# Patient Record
Sex: Female | Born: 1935 | Race: White | Hispanic: No | Smoking: Never smoker
Health system: Southern US, Community
[De-identification: ages and names within clinical notes are randomized; demographics above are authoritative.]

## PROBLEM LIST (undated history)

## (undated) DIAGNOSIS — E119 Type 2 diabetes mellitus without complications: Secondary | ICD-10-CM

## (undated) DIAGNOSIS — F329 Major depressive disorder, single episode, unspecified: Secondary | ICD-10-CM

## (undated) DIAGNOSIS — M6281 Muscle weakness (generalized): Secondary | ICD-10-CM

## (undated) DIAGNOSIS — I639 Cerebral infarction, unspecified: Secondary | ICD-10-CM

## (undated) DIAGNOSIS — R269 Unspecified abnormalities of gait and mobility: Secondary | ICD-10-CM

## (undated) DIAGNOSIS — G309 Alzheimer's disease, unspecified: Secondary | ICD-10-CM

## (undated) DIAGNOSIS — F32A Depression, unspecified: Secondary | ICD-10-CM

## (undated) DIAGNOSIS — F0390 Unspecified dementia without behavioral disturbance: Secondary | ICD-10-CM

## (undated) DIAGNOSIS — I10 Essential (primary) hypertension: Secondary | ICD-10-CM

## (undated) DIAGNOSIS — F0280 Dementia in other diseases classified elsewhere without behavioral disturbance: Secondary | ICD-10-CM

## (undated) DIAGNOSIS — M810 Age-related osteoporosis without current pathological fracture: Secondary | ICD-10-CM

## (undated) DIAGNOSIS — R1310 Dysphagia, unspecified: Secondary | ICD-10-CM

## (undated) DIAGNOSIS — E785 Hyperlipidemia, unspecified: Secondary | ICD-10-CM

---

## 2000-03-31 ENCOUNTER — Other Ambulatory Visit: Admission: RE | Admit: 2000-03-31 | Discharge: 2000-03-31 | Payer: Self-pay | Admitting: Family Medicine

## 2003-03-29 ENCOUNTER — Emergency Department (HOSPITAL_COMMUNITY): Admission: EM | Admit: 2003-03-29 | Discharge: 2003-03-29 | Payer: Self-pay | Admitting: Emergency Medicine

## 2003-03-29 ENCOUNTER — Inpatient Hospital Stay (HOSPITAL_COMMUNITY): Admission: AD | Admit: 2003-03-29 | Discharge: 2003-03-31 | Payer: Self-pay | Admitting: Neurology

## 2003-03-29 ENCOUNTER — Encounter: Payer: Self-pay | Admitting: Neurology

## 2003-03-30 ENCOUNTER — Encounter: Payer: Self-pay | Admitting: Neurology

## 2003-04-12 ENCOUNTER — Encounter: Admission: RE | Admit: 2003-04-12 | Discharge: 2003-05-10 | Payer: Self-pay | Admitting: Neurology

## 2003-08-09 ENCOUNTER — Inpatient Hospital Stay (HOSPITAL_COMMUNITY): Admission: EM | Admit: 2003-08-09 | Discharge: 2003-08-13 | Payer: Self-pay | Admitting: *Deleted

## 2003-08-09 ENCOUNTER — Encounter: Payer: Self-pay | Admitting: *Deleted

## 2003-09-25 ENCOUNTER — Ambulatory Visit (HOSPITAL_COMMUNITY): Admission: RE | Admit: 2003-09-25 | Discharge: 2003-09-25 | Payer: Self-pay | Admitting: Cardiology

## 2004-02-01 ENCOUNTER — Inpatient Hospital Stay (HOSPITAL_COMMUNITY): Admission: EM | Admit: 2004-02-01 | Discharge: 2004-02-09 | Payer: Self-pay | Admitting: Unknown Physician Specialty

## 2004-02-08 ENCOUNTER — Ambulatory Visit (HOSPITAL_COMMUNITY): Admission: RE | Admit: 2004-02-08 | Discharge: 2004-02-08 | Payer: Self-pay | Admitting: Urology

## 2004-02-09 ENCOUNTER — Inpatient Hospital Stay (HOSPITAL_COMMUNITY)
Admission: RE | Admit: 2004-02-09 | Discharge: 2004-03-05 | Payer: Self-pay | Admitting: Physical Medicine & Rehabilitation

## 2004-07-02 ENCOUNTER — Encounter: Admission: RE | Admit: 2004-07-02 | Discharge: 2004-09-30 | Payer: Self-pay | Admitting: Family Medicine

## 2014-09-26 ENCOUNTER — Other Ambulatory Visit: Payer: Self-pay | Admitting: Internal Medicine

## 2014-09-26 DIAGNOSIS — Z1231 Encounter for screening mammogram for malignant neoplasm of breast: Secondary | ICD-10-CM

## 2014-10-11 ENCOUNTER — Ambulatory Visit
Admission: RE | Admit: 2014-10-11 | Discharge: 2014-10-11 | Disposition: A | Discharge status: Home / Self Care | Payer: Medicare Other | Source: Ambulatory Visit | Attending: Internal Medicine | Admitting: Internal Medicine

## 2014-10-11 ENCOUNTER — Other Ambulatory Visit: Payer: Self-pay | Admitting: Internal Medicine

## 2014-10-11 DIAGNOSIS — N6310 Unspecified lump in the right breast, unspecified quadrant: Secondary | ICD-10-CM

## 2014-10-11 DIAGNOSIS — Z1231 Encounter for screening mammogram for malignant neoplasm of breast: Secondary | ICD-10-CM

## 2015-06-24 ENCOUNTER — Non-Acute Institutional Stay (SKILLED_NURSING_FACILITY): Payer: Medicare Other | Admitting: Internal Medicine

## 2015-06-24 DIAGNOSIS — I69961 Other paralytic syndrome following unspecified cerebrovascular disease affecting right dominant side: Secondary | ICD-10-CM

## 2015-06-24 DIAGNOSIS — I69969 Other paralytic syndrome following unspecified cerebrovascular disease affecting unspecified side: Secondary | ICD-10-CM

## 2015-06-24 DIAGNOSIS — R4701 Aphasia: Secondary | ICD-10-CM

## 2015-06-24 DIAGNOSIS — N390 Urinary tract infection, site not specified: Secondary | ICD-10-CM | POA: Diagnosis not present

## 2015-06-24 DIAGNOSIS — R4702 Dysphasia: Secondary | ICD-10-CM

## 2015-06-24 DIAGNOSIS — I10 Essential (primary) hypertension: Secondary | ICD-10-CM | POA: Diagnosis not present

## 2015-06-29 NOTE — Progress Notes (Signed)
Patient ID: Grace Mays, female   DOB: 09/17/1934, 80 y.o.   MRN: [id number]                HISTORY & PHYSICAL  DATE:  06/24/2015       FAC[facility]: [facility]                LEVEL OF CARE:   SNF   CHIEF COMPLAINT:  Transfer services to my service in this building as requested by her family.    HISTORY OF PRESENT ILLNESS:  This is a 80 year-old woman with a past medical history of a dominant hemisphere intracerebral hemorrhage in 2005.  At that time, she was living in [place] and was cared for in the [facility].  She apparently made a decent recovery and was able to speak in halting sentences.  Able to take care of herself.  Sometime after that, the patient moved to [place], [place], to be with one of her daughters.    A CT scan from 2005 showed a left thalamic hemorrhage and associated edema in this region.  She also was noted at the time to have an old left occipital infarct and a right ganglia lacunar infarct without change.  She was already noted at that time to have chronic small vessel disease.     The patient was at some point in [place], put into a facility.  She was in the emergency room on 06/30/2014, having fallen and hit her head with decreased level of consciousness.  A CT scan of the head at that time did not show any acute changes.  She was transferred back to [place] and admitted to this facility at that time.    As I understand things, the patient is total care in the facility.  She is a Physiological scientist.  She is doubly incontinent.    Recent problems have included UTIs, apparently with mostly behavioral issues.  Recent urine culture showed greater than 100,000 Proteus which was quinolone resistant.   The patient is pen allergic.  She was, therefore, treated with ertapenem.  I would imagine the differentiation of whether this is true infection versus asymptomatic bacteriuria is unclear.  I have spoken with her daughter on the phone.  At the  time she has a UTI, she finds her lethargic with declining oral intake, which seems to respond to antibiotic treatment.     LABORATORY DATA:  Recent lab work on 04/17/2015:    CBC:  Essentially normal with a white count  of 6.6, hemoglobin 13.3, platelet count 329.    Comprehensive metabolic panel:  Normal including a sodium of 140, potassium 4.5, calcium 9.8, albumin 3.8, BUN 18, creatinine 0.7.    Liver function tests:  Normal.    Hemoglobin A1c:  6.5.    PAST MEDICAL HISTORY/PROBLEM LIST:        Hypertension.    Hyperglycemia, without a firm diagnosis of diabetes.    History of DVT.    History of cerebral hemorrhage, requiring surgery.    History of dementia, labeled as Alzheimer's disease.    History of an MI.    CURRENT MEDICATIONS:  Medication list is reviewed.            Lisinopril 2.5 q.d.     Senokot 8.6, 2 tablets daily.    Fosamax 70 q.week.    ASA 81 q.d.      Pro-Stat 30 mL daily.    Peridex mouth rinse b.i.d.  Cranberry 400 b.i.d.      Tums 500 three times a day.    Lipitor 10 q.d.    Aricept 10 q.d.      Vitamin D3, 50,000 U monthly.    Resource 60 mL three times a day.      SOCIAL HISTORY:                   FUNCTIONAL STATUS:  She is total care, doubly incontinent.  I have spoken to her daughter on the phone.  She feels that her mother can still recognize her and still has a meaningful quality of life.   ADVANCED DIRECTIVES:  The patient is a DNR in the facility.    REVIEW OF SYSTEMS:    Not possible from the patient.      PHYSICAL EXAMINATION:   GENERAL APPEARANCE:    The patient is awake.  Seems to make eye contact with you.   HEENT:   MOUTH/THROAT:    She would not open her mouth.   EYES:  Pupils are equal and reactive.   LYMPHATICS:  None palpable in the cervical, clavicular, or axillary areas.   BREASTS:  Examination deferred.    CHEST/RESPIRATORY:  Clear air entry bilaterally.     CARDIOVASCULAR:   CARDIAC:  Heart sounds are  normal.  There are no murmurs.  She appears to be euvolemic.    GASTROINTESTINAL:   LIVER/SPLEEN/KIDNEY:   No liver, no spleen.  No tenderness.   No stigmata of chronic liver disease.    GENITOURINARY:   BLADDER:  Not enlarged.  She has no CVA tenderness.    CIRCULATION:   ARTERIAL:  Peripheral pulses are not palpable below the popliteal.  Probably some degree of PAD.   MUSCULOSKELETAL:   No obvious acute joints.   NEUROLOGICAL:   SENSATION/STRENGTH:  The patient is stronger on the left side.  She is able to lift the left arm, but not the right.  She is also able to withdraw both legs, but the right side is weaker.   OTHER:  I suspect this woman is probably globally aphasic as a result of dominant hemisphere injury.    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     Underlying Alzheimer's disease, as listed on this lady's records.   According to her daughter, she is also quite deaf.  I do not know how you would diagnose Alzheimer's disease in the face of this type of deficit.  She could very well have multi-infarct dementia.    Hypertension.  We will monitor.    Recurrent UTIs.  I discussed this with her daughter.  Most of this appears to be behavioral issues.  These would include lethargy, declining oral intake, etc.  She has had recent lab work that really looked fairly unremarkable in June, as listed above.    I will discuss this with the [facility] nurse practitioner when she returns.  I spent some time talking to her daughter on the phone today.       CPT CODE: 16109

## 2015-07-11 ENCOUNTER — Non-Acute Institutional Stay (SKILLED_NURSING_FACILITY): Payer: Medicare Other | Admitting: Internal Medicine

## 2015-07-11 DIAGNOSIS — R4702 Dysphasia: Secondary | ICD-10-CM

## 2015-07-11 DIAGNOSIS — I69961 Other paralytic syndrome following unspecified cerebrovascular disease affecting right dominant side: Secondary | ICD-10-CM

## 2015-07-11 DIAGNOSIS — F0150 Vascular dementia without behavioral disturbance: Secondary | ICD-10-CM | POA: Diagnosis not present

## 2015-07-11 DIAGNOSIS — R4701 Aphasia: Secondary | ICD-10-CM

## 2015-07-11 DIAGNOSIS — I69969 Other paralytic syndrome following unspecified cerebrovascular disease affecting unspecified side: Secondary | ICD-10-CM

## 2015-07-16 NOTE — Progress Notes (Signed)
Patient ID: Grace Mays, female   DOB: 1934/11/04, 80 y.o.   MRN: [id number]                PROGRESS NOTE  DATE:  07/11/2015            FACILITY: [facility]                  LEVEL OF CARE:   SNF   Acute Visit                   CHIEF COMPLAINT:  Follow up transfer to my service two weeks ago.    HISTORY OF PRESENT ILLNESS:  I reviewed this patient for the first time two weeks ago at the request of family members to change to my care.  She is also followed by [facility] in the building.     This is a patient who had a dominant hemisphere CVA in 2005 that left her with cortical language disturbance, but she made an improvement.   Sometime over the years, she became globally aphasic.  However, most of her care went on in [place], [place].    She is also listed as having underlying Alzheimers disease.    SOCIAL HISTORY:     ADVANCED DIRECTIVES:  The patient is a DNR.  No hospitalizations.    REVIEW OF SYSTEMS:   Not possible in this patient who is globally aphasic and possibly with dementia.     PHYSICAL EXAMINATION:   VITAL SIGNS:     TEMPERATURE:  98.2.    PULSE:  74.    RESPIRATIONS:  16.     BLOOD PRESSURE:  132/76.     02 SATURATIONS:  96%, as measured by myself.    CHEST/RESPIRATORY:  Clear air entry bilaterally.    CARDIOVASCULAR:   CARDIAC:  Heart sounds are normal.  She appears to be euvolemic.         GASTROINTESTINAL:   ABDOMEN:  Soft.  There are no masses.    GENITOURINARY:   BLADDER:  Not enlarged.  She has no CVA tenderness.       ASSESSMENT/PLAN:                  Late-effect dominant hemisphere CVA with right-sided weakness and global aphasia.     Listed as having underlying Alzheimers disease.   I cannot get anything out of this woman, even to nonverbal things.  She may have Alzheimers disease or more likely  a multi-infarct state.    Hypertension.  This appears to be well controlled.     Recurrent UTIs.  I gather that most of this appears  to be behavioral issues in the nursing home.  So far, there has been none of this.     The patient appears stable.    Her most recent weight is 133 pounds, which is actually an increase across this year without any obvious edema.

## 2015-08-13 ENCOUNTER — Non-Acute Institutional Stay (SKILLED_NURSING_FACILITY): Payer: Medicare Other | Admitting: Internal Medicine

## 2015-08-13 DIAGNOSIS — R4702 Dysphasia: Secondary | ICD-10-CM | POA: Diagnosis not present

## 2015-08-13 DIAGNOSIS — R4701 Aphasia: Secondary | ICD-10-CM

## 2015-08-13 DIAGNOSIS — N390 Urinary tract infection, site not specified: Secondary | ICD-10-CM

## 2015-08-13 DIAGNOSIS — F0150 Vascular dementia without behavioral disturbance: Secondary | ICD-10-CM

## 2015-08-16 NOTE — Progress Notes (Addendum)
Patient ID: Grace PhillipsSusan K Mays, female   DOB: [birth year], 80 y.o.   MRN: [id number]                PROGRESS NOTE  DATE:  08/13/2015           [facility]                    LEVEL OF CARE:   SNF   Routine Visit             CHIEF COMPLAINT:  Routine visit to follow medical issues/[facility] visit/review of [facility] records.       HISTORY OF PRESENT ILLNESS:  This is a patient whom I admitted to my services in the beginning of September.    The patient is a woman who had a dominant hemisphere CVA in 2005 that left her with cortical language disturbance, mild gait ataxia, but she made a good recovery.  She was then lost to our records, moved to [place], only to recently return.  She is also listed as having underlying dementia.     The patient is essentially nonverbal.  I cannot even get her to really respond to nonverbal issues.  She basically sits in her wheelchair watching people go by.      PAST MEDICAL HISTORY/PROBLEM LIST:   Reviewed.      SOCIAL HISTORY:       ADVANCED DIRECTIVES:  The patient is a DNR.  She is No Hospitalizations.     CURRENT MEDICATIONS:  Medication list is reviewed.      Zestril 2.5 q.d.     Senna 2 tablets q.h.s.      Fosamax 70 weekly.    Aspirin 81 q.d.      Peridex orally b.i.d.      Tums 500 three times daily.     Pro-Stat 30 mL three times a day.     Lipitor 10 q.d.      Aricept 10 q.d.      Vitamin D3, 50,000 U monthly.     Resource nutritional supplement three times a day.    REVIEW OF SYSTEMS:   Not possible.       PHYSICAL EXAMINATION:   VITAL SIGNS:     TEMPERATURE:  97.2.     PULSE:  76.     RESPIRATIONS:  16.    BLOOD PRESSURE:  124/68.    CHEST/RESPIRATORY:  Clear air entry bilaterally.    CARDIOVASCULAR:   CARDIAC:  Heart sounds are normal.  She appears to be euvolemic.       GASTROINTESTINAL:   ABDOMEN:  Soft.  No masses.  No tenderness.     GENITOURINARY:   BLADDER:   Not enlarged.   She has no CVA  tenderness.     ASSESSMENT/PLAN:               Late-effect dominant hemisphere CVA.   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  Whether or not she has underlying Alzheimer's disease, as well, would be virtually impossible to determine now.    Hypertension.  This appears to be well managed.    History of recurrent UTIs with largely behavioral issues.  There do not appear to be any obvious problems here recently.

## 2015-08-22 ENCOUNTER — Other Ambulatory Visit: Payer: Self-pay

## 2015-08-22 ENCOUNTER — Emergency Department (HOSPITAL_COMMUNITY): Payer: Medicare Other

## 2015-08-22 ENCOUNTER — Emergency Department (HOSPITAL_COMMUNITY)
Admission: EM | Admit: 2015-08-22 | Discharge: 2015-08-22 | Disposition: A | Discharge status: Home / Self Care | Payer: Medicare Other | Attending: Emergency Medicine | Admitting: Emergency Medicine

## 2015-08-22 ENCOUNTER — Encounter (HOSPITAL_COMMUNITY): Payer: Self-pay

## 2015-08-22 DIAGNOSIS — R55 Syncope and collapse: Secondary | ICD-10-CM | POA: Diagnosis not present

## 2015-08-22 DIAGNOSIS — F329 Major depressive disorder, single episode, unspecified: Secondary | ICD-10-CM | POA: Diagnosis not present

## 2015-08-22 DIAGNOSIS — Z79899 Other long term (current) drug therapy: Secondary | ICD-10-CM | POA: Diagnosis not present

## 2015-08-22 DIAGNOSIS — Z8673 Personal history of transient ischemic attack (TIA), and cerebral infarction without residual deficits: Secondary | ICD-10-CM | POA: Insufficient documentation

## 2015-08-22 DIAGNOSIS — F0280 Dementia in other diseases classified elsewhere without behavioral disturbance: Secondary | ICD-10-CM | POA: Insufficient documentation

## 2015-08-22 DIAGNOSIS — E785 Hyperlipidemia, unspecified: Secondary | ICD-10-CM | POA: Diagnosis not present

## 2015-08-22 DIAGNOSIS — N390 Urinary tract infection, site not specified: Secondary | ICD-10-CM | POA: Diagnosis not present

## 2015-08-22 DIAGNOSIS — G309 Alzheimer's disease, unspecified: Secondary | ICD-10-CM | POA: Insufficient documentation

## 2015-08-22 DIAGNOSIS — Z880 Allergy status to penicillin: Secondary | ICD-10-CM | POA: Diagnosis not present

## 2015-08-22 DIAGNOSIS — Z7982 Long term (current) use of aspirin: Secondary | ICD-10-CM | POA: Insufficient documentation

## 2015-08-22 DIAGNOSIS — E119 Type 2 diabetes mellitus without complications: Secondary | ICD-10-CM | POA: Insufficient documentation

## 2015-08-22 DIAGNOSIS — I10 Essential (primary) hypertension: Secondary | ICD-10-CM | POA: Insufficient documentation

## 2015-08-22 HISTORY — DX: Essential (primary) hypertension: I10

## 2015-08-22 HISTORY — DX: Unspecified abnormalities of gait and mobility: R26.9

## 2015-08-22 HISTORY — DX: Depression, unspecified: F32.A

## 2015-08-22 HISTORY — DX: Dementia in other diseases classified elsewhere, unspecified severity, without behavioral disturbance, psychotic disturbance, mood disturbance, and anxiety: F02.80

## 2015-08-22 HISTORY — DX: Alzheimer's disease, unspecified: G30.9

## 2015-08-22 HISTORY — DX: Unspecified dementia, unspecified severity, without behavioral disturbance, psychotic disturbance, mood disturbance, and anxiety: F03.90

## 2015-08-22 HISTORY — DX: Cerebral infarction, unspecified: I63.9

## 2015-08-22 HISTORY — DX: Major depressive disorder, single episode, unspecified: F32.9

## 2015-08-22 HISTORY — DX: Hyperlipidemia, unspecified: E78.5

## 2015-08-22 HISTORY — DX: Dysphagia, unspecified: R13.10

## 2015-08-22 HISTORY — DX: Muscle weakness (generalized): M62.81

## 2015-08-22 HISTORY — DX: Type 2 diabetes mellitus without complications: E11.9

## 2015-08-22 HISTORY — DX: Age-related osteoporosis without current pathological fracture: M81.0

## 2015-08-22 LAB — URINE MICROSCOPIC-ADD ON

## 2015-08-22 LAB — COMPREHENSIVE METABOLIC PANEL
ALBUMIN: 3.3 g/dL — AB (ref 3.5–5.0)
ALK PHOS: 66 U/L (ref 38–126)
ALT: 22 U/L (ref 14–54)
AST: 26 U/L (ref 15–41)
Anion gap: 12 (ref 5–15)
BILIRUBIN TOTAL: 0.5 mg/dL (ref 0.3–1.2)
BUN: 17 mg/dL (ref 6–20)
CALCIUM: 9.4 mg/dL (ref 8.9–10.3)
CO2: 23 mmol/L (ref 22–32)
Chloride: 108 mmol/L (ref 101–111)
Creatinine, Ser: 0.79 mg/dL (ref 0.44–1.00)
GFR calc Af Amer: >60 mL/min (ref >60)
GFR calc non Af Amer: >60 mL/min (ref >60)
GLUCOSE: 120 mg/dL — AB (ref 65–99)
Potassium: 4.1 mmol/L (ref 3.5–5.1)
Sodium: 143 mmol/L (ref 135–145)
TOTAL PROTEIN: 7.3 g/dL (ref 6.5–8.1)

## 2015-08-22 LAB — CBC WITH DIFFERENTIAL/PLATELET
Basophils Absolute: 0 10*3/uL (ref 0.0–0.1)
Basophils Relative: 0 %
EOS PCT: 0 %
Eosinophils Absolute: 0 10*3/uL (ref 0.0–0.7)
HEMATOCRIT: 43.8 % (ref 36.0–46.0)
HEMOGLOBIN: 14 g/dL (ref 12.0–15.0)
LYMPHS ABS: 1 10*3/uL (ref 0.7–4.0)
LYMPHS PCT: 7 %
MCH: 29.9 pg (ref 26.0–34.0)
MCHC: 32 g/dL (ref 30.0–36.0)
MCV: 93.6 fL (ref 78.0–100.0)
MONO ABS: 0.6 10*3/uL (ref 0.1–1.0)
MONOS PCT: 4 %
NEUTROS ABS: 12.7 10*3/uL — AB (ref 1.7–7.7)
Neutrophils Relative %: 89 %
Platelets: 213 10*3/uL (ref 150–400)
RBC: 4.68 MIL/uL (ref 3.87–5.11)
RDW: 14.2 % (ref 11.5–15.5)
WBC: 14.3 10*3/uL — ABNORMAL HIGH (ref 4.0–10.5)

## 2015-08-22 LAB — I-STAT CHEM 8, ED
BUN: 21 mg/dL — ABNORMAL HIGH (ref 6–20)
CREATININE: 0.8 mg/dL (ref 0.44–1.00)
Calcium, Ion: 1.14 mmol/L (ref 1.13–1.30)
Chloride: 107 mmol/L (ref 101–111)
GLUCOSE: 117 mg/dL — AB (ref 65–99)
HCT: 47 % — ABNORMAL HIGH (ref 36.0–46.0)
HEMOGLOBIN: 16 g/dL — AB (ref 12.0–15.0)
Potassium: 4 mmol/L (ref 3.5–5.1)
Sodium: 144 mmol/L (ref 135–145)
TCO2: 23 mmol/L (ref 0–100)

## 2015-08-22 LAB — URINALYSIS, ROUTINE W REFLEX MICROSCOPIC
BILIRUBIN URINE: NEGATIVE
GLUCOSE, UA: NEGATIVE mg/dL
KETONES UR: NEGATIVE mg/dL
Nitrite: NEGATIVE
PH: 6 (ref 5.0–8.0)
Protein, ur: 30 mg/dL — AB
Specific Gravity, Urine: 1.02 (ref 1.005–1.030)
Urobilinogen, UA: 0.2 mg/dL (ref 0.0–1.0)

## 2015-08-22 MED ORDER — LEVOFLOXACIN 250 MG PO TABS: 500.0000 mg | ORAL_TABLET | Freq: Every day | ORAL | Status: AC

## 2015-08-22 MED ORDER — LEVOFLOXACIN 250 MG PO TABS
250.0000 mg | ORAL_TABLET | Freq: Once | ORAL | Status: AC
  Administered 2015-08-22: 250 mg via ORAL
  Filled 2015-08-22: qty 1

## 2015-08-22 MED ORDER — SODIUM CHLORIDE 0.9 % IV BOLUS (SEPSIS): 500.0000 mL | Freq: Once | INTRAVENOUS | Status: DC

## 2015-08-22 NOTE — ED Notes (Addendum)
Pt. 's daughter reported that Staff at the SNF reported that pt. Bit her tongue X2.  Reported to Dr. Estell HarpinZammit.  Also informed Dr. Estell HarpinZammit unable to establish and IV due to difficulty.  Lawson FiscalLori, RN and Lanora ManisElizabeth RN attempted X3 unsuccessful each time.  Unable to advance the catheter.  Dressings applied to insertion sites. No bleeding, swelling or bruising noted.  Dr. Estell HarpinZammit would like to wait until all of pt.s labs are back.

## 2015-08-22 NOTE — ED Notes (Addendum)
Pt.s daughter reports that staff reported that her mother was sitting in her chair and giggling with the staff.  The Staff left and came back and found pt. Slumped over in her chair.   Staff felt that pt. Might have had a seizure. Staff did not witness a Seizure.  Unknown time of unresponsiveness while in the chair. Pt. Is alert and watching RN at the bedside

## 2015-08-22 NOTE — Discharge Instructions (Signed)
Follow up with your family md this week or next week.  Return if any problems,   Drink more fluids

## 2015-08-22 NOTE — ED Notes (Signed)
In and out cath done for an urine specimen.  Pt. Tolerated procedure well, Cher NakaiLori Tascha Casares, RN and Josie DixonElizabeth P, Rn  Performed the procedure without any difficulty.  Sterile technique procedure  Maintained throughout procedure.

## 2015-08-22 NOTE — ED Provider Notes (Signed)
CSN: [id number]     Arrival date & time 08/22/15  0920 History   First MD Initiated Contact with Patient 08/22/15 [phone]     Chief Complaint  Patient presents with  . Near Syncope     (Consider location/radiation/quality/duration/timing/severity/associated sxs/prior Treatment) Patient is a 80 y.o. female presenting with near-syncope. The history is provided by a relative (Patient had an episode at the nursing home where she passed out while she was in the chair. She is a little more lethargic than normal now.).  Near Syncope This is a new problem. The current episode started less than 1 hour ago. The problem occurs rarely. The problem has been rapidly worsening. Pertinent negatives include no chest pain. Nothing aggravates the symptoms. Nothing relieves the symptoms.    Past Medical History  Diagnosis Date  . Diabetes mellitus without complication (HCC)   . Hypertension   . Depression   . Stroke (HCC)   . Osteoporosis   . Dementia     behavioral disturbances  . Hyperlipidemia   . Alzheimer's disease   . Muscle weakness (generalized)   . Dysphagia   . Gait abnormality    History reviewed. No pertinent past surgical history. No family history on file. Social History  Substance Use Topics  . Smoking status: Never Smoker   . Smokeless tobacco: None  . Alcohol Use: No   OB History    No data available     Review of Systems  Unable to perform ROS: Dementia  Cardiovascular: Positive for near-syncope. Negative for chest pain.      Allergies  Penicillins and Petroleum jelly  Home Medications   Prior to Admission medications   Medication Sig Start Date End Date Taking? Authorizing Provider  alendronate (FOSAMAX) 70 MG tablet Take 70 mg by mouth once a week. Take with a full glass of water on an empty stomach.   Yes Historical Provider, MD  Amino Acids-Protein Hydrolys (FEEDING SUPPLEMENT, PRO-STAT SUGAR FREE 64,) LIQD Take 30 mLs by mouth 3 (three) times daily with  meals.   Yes Historical Provider, MD  aspirin 81 MG tablet Take 81 mg by mouth daily.   Yes Historical Provider, MD  atorvastatin (LIPITOR) 10 MG tablet Take 10 mg by mouth daily.   Yes Historical Provider, MD  calcium carbonate (TUMS - DOSED IN MG ELEMENTAL CALCIUM) 500 MG chewable tablet Chew 1 tablet by mouth 3 (three) times daily with meals.   Yes Historical Provider, MD  chlorhexidine (PERIDEX) 0.12 % solution Use as directed 15 mLs in the mouth or throat 2 (two) times daily.   Yes Historical Provider, MD  donepezil (ARICEPT) 10 MG tablet Take 10 mg by mouth at bedtime.   Yes Historical Provider, MD  ertapenem [facility]) 1 G injection Inject 1 g into the muscle once.   Yes Historical Provider, MD  feeding supplement (BOOST / RESOURCE BREEZE) LIQD Take 1 Container by mouth 3 (three) times daily between meals.   Yes Historical Provider, MD  lisinopril (PRINIVIL,ZESTRIL) 2.5 MG tablet Take 2.5 mg by mouth daily.   Yes Historical Provider, MD  senna (SENOKOT) 8.6 MG tablet Take 2 tablets by mouth daily.   Yes Historical Provider, MD  Vitamin D, Ergocalciferol, (DRISDOL) 50000 UNITS CAPS capsule Take 50,000 Units by mouth every 30 (thirty) days.   Yes Historical Provider, MD  levofloxacin (LEVAQUIN) 250 MG tablet Take 2 tablets (500 mg total) by mouth daily. 08/22/15   Bethann Berkshire, MD   BP 127/90 mmHg  Pulse  76  Temp(Src) 97.8 F (36.6 C) (Rectal)  Resp 13  Ht  (1.6 m)  Wt 133 lb (60.328 kg)  BMI 23.57 kg/m2  SpO2 97% Physical Exam  Constitutional: She appears well-developed.  HENT:  Head: Normocephalic.  Eyes: Conjunctivae and EOM are normal. No scleral icterus.  Neck: Neck supple. No thyromegaly present.  Cardiovascular: Normal rate and regular rhythm.  Exam reveals no gallop and no friction rub.   No murmur heard. Pulmonary/Chest: No stridor. She has no wheezes. She has no rales. She exhibits no tenderness.  Abdominal: She exhibits no distension. There is no tenderness. There  is no rebound.  Musculoskeletal: Normal range of motion. She exhibits no edema.  Lymphadenopathy:    She has no cervical adenopathy.  Neurological: She is alert. She exhibits normal muscle tone. Coordination normal.  Patient is alert will not answer any questions. Does not speak. This is her normal. Will not follow commands. This is also her normal  Skin: No rash noted. No erythema.    ED Course  Procedures (including critical care time) Labs Review Labs Reviewed  CBC WITH DIFFERENTIAL/PLATELET - Abnormal; Notable for the following:    WBC 14.3 (*)    Neutro Abs 12.7 (*)    All other components within normal limits  COMPREHENSIVE METABOLIC PANEL - Abnormal; Notable for the following:    Glucose, Bld 120 (*)    Albumin 3.3 (*)    All other components within normal limits  URINALYSIS, ROUTINE W REFLEX MICROSCOPIC (NOT AT [facility]) - Abnormal; Notable for the following:    Color, Urine STRAW (*)    APPearance TURBID (*)    Hgb urine dipstick MODERATE (*)    Protein, ur 30 (*)    Leukocytes, UA SMALL (*)    All other components within normal limits  URINE MICROSCOPIC-ADD ON - Abnormal; Notable for the following:    Squamous Epithelial / LPF MANY (*)    Bacteria, UA MANY (*)    All other components within normal limits  I-STAT CHEM 8, ED - Abnormal; Notable for the following:    BUN 21 (*)    Glucose, Bld 117 (*)    Hemoglobin 16.0 (*)    HCT 47.0 (*)    All other components within normal limits  URINE CULTURE    Imaging Review Ct Head Wo Contrast  08/22/2015  CLINICAL DATA:  Altered mental status today. EXAM: CT HEAD WITHOUT CONTRAST TECHNIQUE: Contiguous axial images were obtained from the base of the skull through the vertex without intravenous contrast. COMPARISON:  Head CT scan 02/16/2004. FINDINGS: The brain is atrophic with chronic microvascular ischemic change. Remote left occipital, left thalamic full and right caudate head infarcts are again seen. No evidence of acute  abnormality including hemorrhage, infarct, mass lesion, mass effect, midline shift or abnormal extra-axial fluid collection is identified. No hydrocephalus or pneumocephalus. The calvarium is intact. The patient is status post left mastoidectomy. Small amount of residual mastoid air cells on the left have fluid within them. IMPRESSION: No acute abnormality. Atrophy, chronic microvascular ischemic change and remote infarcts as above. Electronically Signed   By: Drusilla Kanner M.D.   On: 08/22/2015 10:55   Dg Chest Port 1 View  08/22/2015  CLINICAL DATA:  Syncope. EXAM: PORTABLE CHEST 1 VIEW COMPARISON:  February 07, 2004. FINDINGS: The heart size and mediastinal contours are within normal limits. No pneumothorax is noted. Minimal left pleural effusion is noted. No acute pulmonary disease is noted. The visualized skeletal  structures are unremarkable. IMPRESSION: Minimal left pleural effusion. No other abnormality seen in the chest. Electronically Signed   By: Lupita RaiderJames  Green Jr, M.D.   On: 08/22/2015 10:25   I have personally reviewed and evaluated these images and lab results as part of my medical decision-making.   EKG Interpretation   Date/Time:  Wednesday August 22 2015 09:37:23 EDT Ventricular Rate:  77 PR Interval:  72 QRS Duration: 80 QT Interval:  379 QTC Calculation: 429 R Axis:   -127 Text Interpretation:  Sinus rhythm Short PR interval Right axis deviation  Low voltage, extremity and precordial leads Borderline repolarization  abnormality Artifact in lead(s) I II III aVR aVL Confirmed by Arlin Savona  MD,  Jomarie LongsJOSEPH ([id number]) on 08/22/2015 11:30:50 AM Also confirmed by Aishani Kalis  MD,  Jomarie LongsJOSEPH ([id number]  on 08/22/2015 12:24:35 PM      MDM   Final diagnoses:  UTI (lower urinary tract infection)  Syncope and collapse    Patient is back to normal.  Labs suggest mild dehydration and urinary tract infection. Chest x-ray unremarkable CT scan shows the atrophy. Patient was eating and drinking him back  to normal at discharge she isn't demented lady, nursing home who is on ambulatory does not speak and is a DO NOT RESUSCITATE. I spoke to the family and since she was back to her normal will discharge her back to the nursing home with a prescription of Levaquin for urinary tract infection and instruct the nursing home to have her drink more fluids she is to follow-up with her PCP    Bethann BerkshireJoseph Carroll Ranney, MD 08/22/15 1246

## 2015-08-23 LAB — URINE CULTURE

## 2015-10-28 DEATH — deceased

## 2015-10-30 ENCOUNTER — Other Ambulatory Visit: Payer: Self-pay | Admitting: *Deleted

## 2015-10-30 MED ORDER — AMBULATORY NON FORMULARY MEDICATION: Status: AC

## 2015-10-30 NOTE — Telephone Encounter (Signed)
Disregard. No Refill. We no longer go to [facility].

## 2015-10-30 NOTE — Telephone Encounter (Signed)
[facility] 

## 2016-10-14 IMAGING — CT CT HEAD W/O CM
4 of 6 series · 18 of 30 positions shown, 19 images · non-contrast
Comparison: Head CT scan 02/16/2004.

CLINICAL DATA: Altered mental status today.

EXAM:
CT HEAD WITHOUT CONTRAST
TECHNIQUE: Contiguous axial images were obtained from the base of the skull
through the vertex without intravenous contrast.

[Series 2: head without · axial · non-contrast · 0.44mm/px · z∈[-150,+25]mm · 3 of 36 slices shown, 4 images]
[im 1/36  brain]
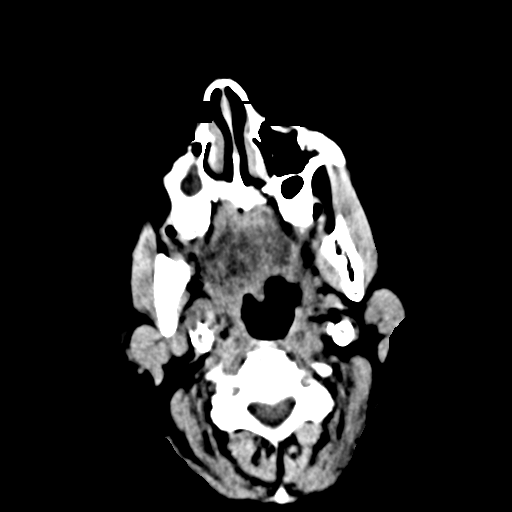
[im 1/36  bone]
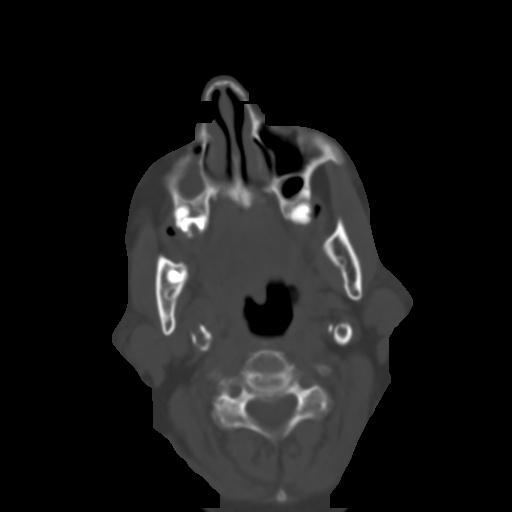
[im 18/36  brain]
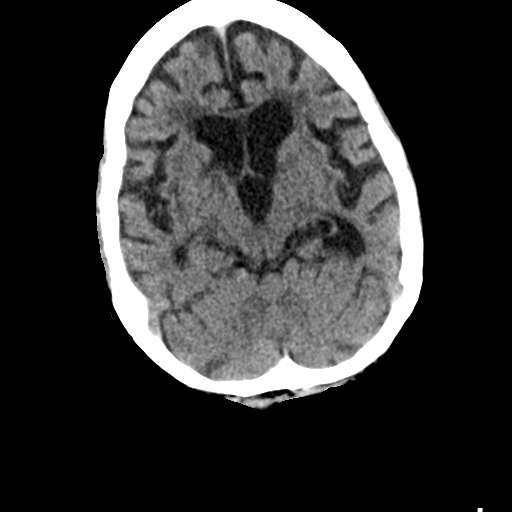
[im 36/36  brain]
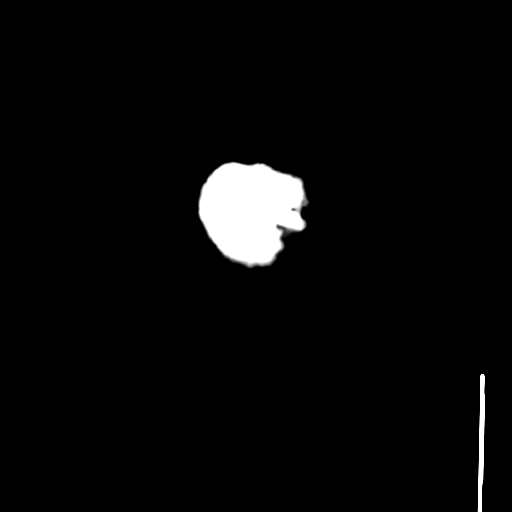

[Series 3: head bone · axial · 0.44mm/px · z∈[-122,-4]mm · 5 of 89 slices shown (1 of 3)]
[im 15/89  bone]
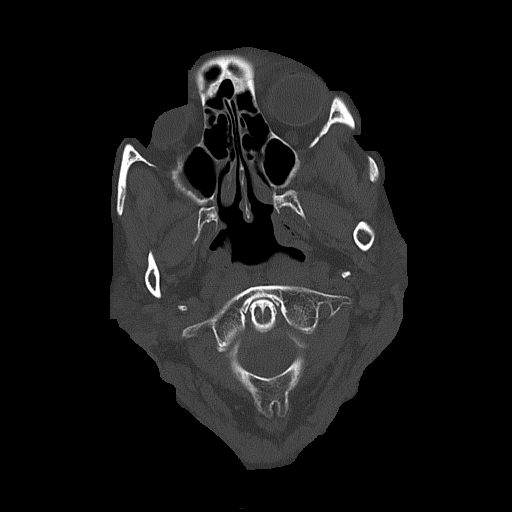
[im 30/89  bone]
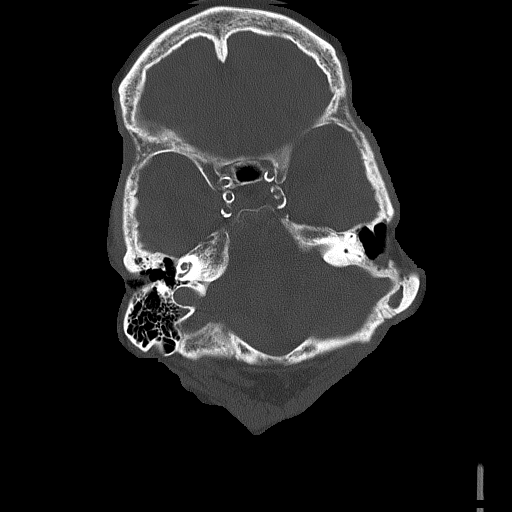
[im 45/89  bone]
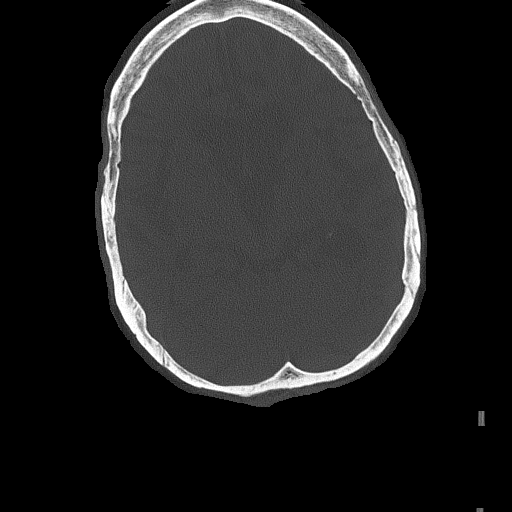
[im 59/89  bone]
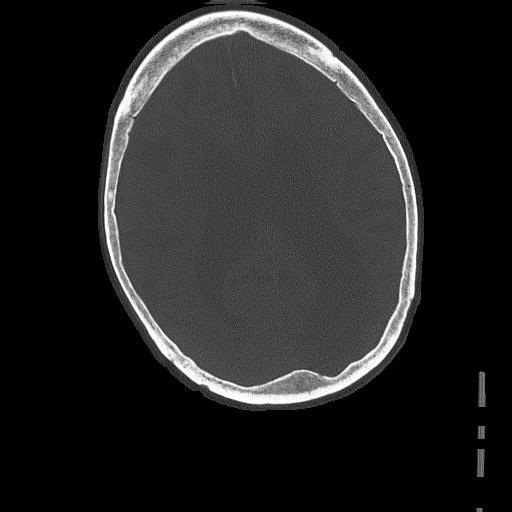
[im 74/89  bone]
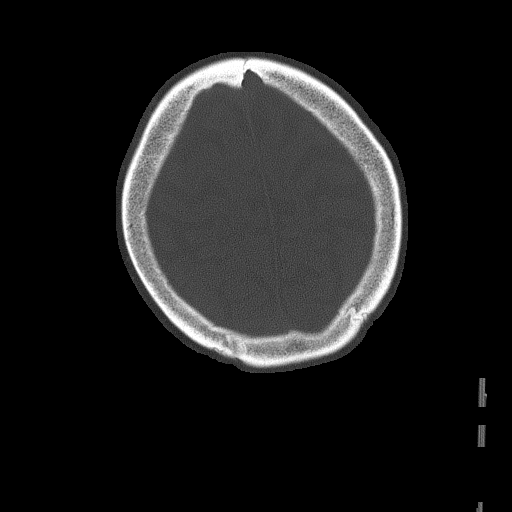

[Series 5: head bone · axial · 0.44mm/px · z∈[-126,-8]mm · 5 of 89 slices shown (2 of 3)]
[im 15/89  bone]
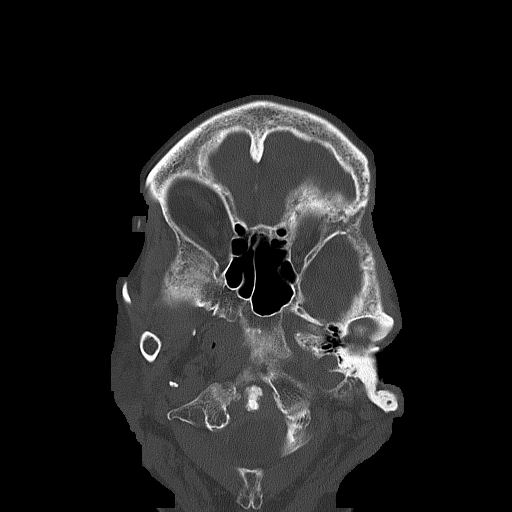
[im 30/89  bone]
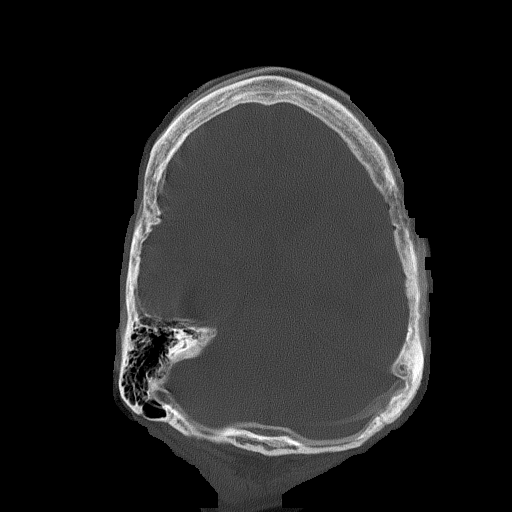
[im 45/89  bone]
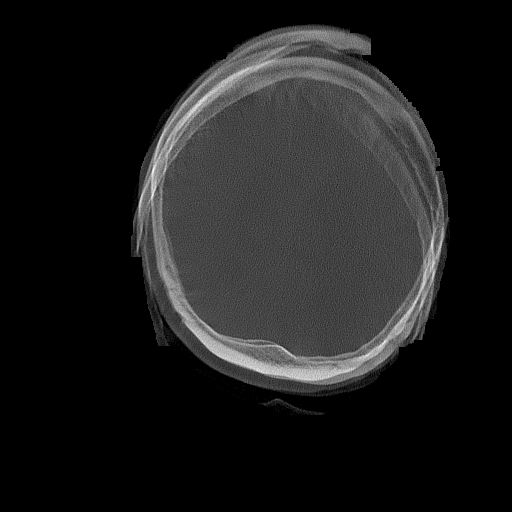
[im 59/89  bone]
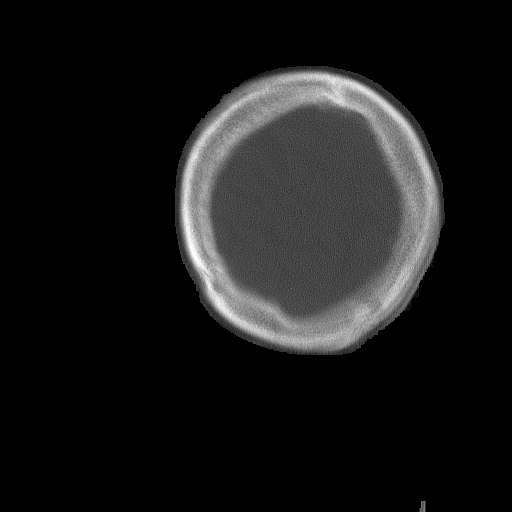
[im 74/89  bone]
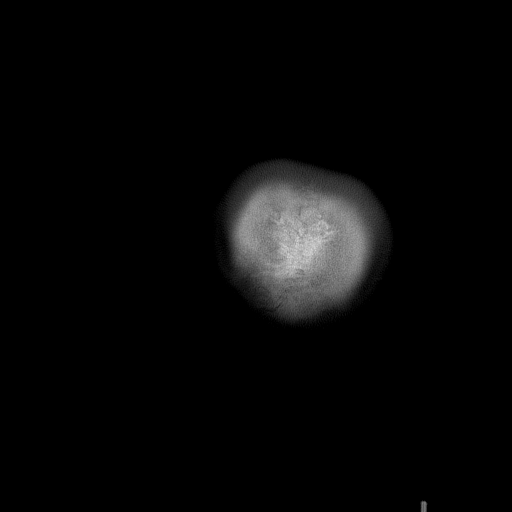

[Series 6: head bone · axial · 0.49mm/px · z∈[-124,-6]mm · 5 of 89 slices shown (3 of 3)]
[im 15/89  bone]
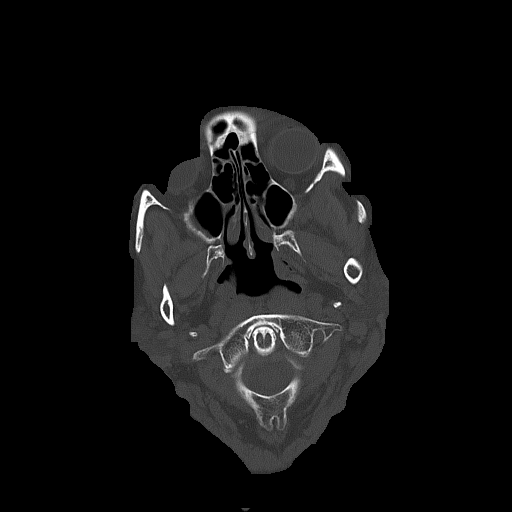
[im 30/89  bone]
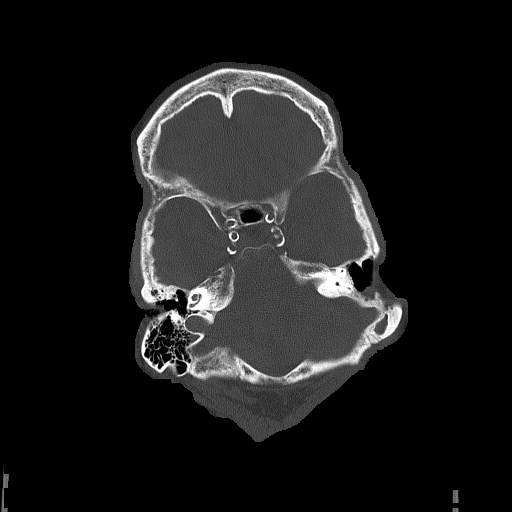
[im 45/89  bone]
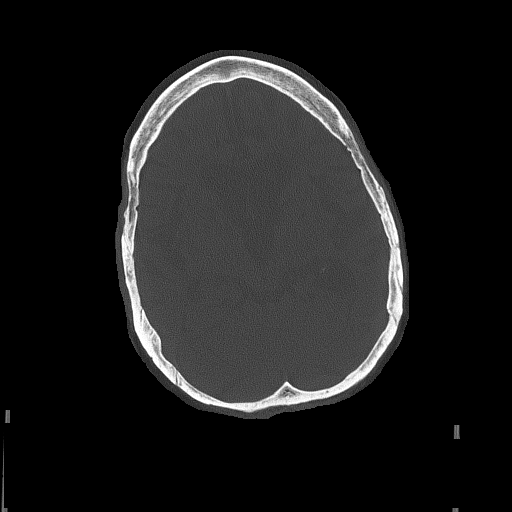
[im 59/89  bone]
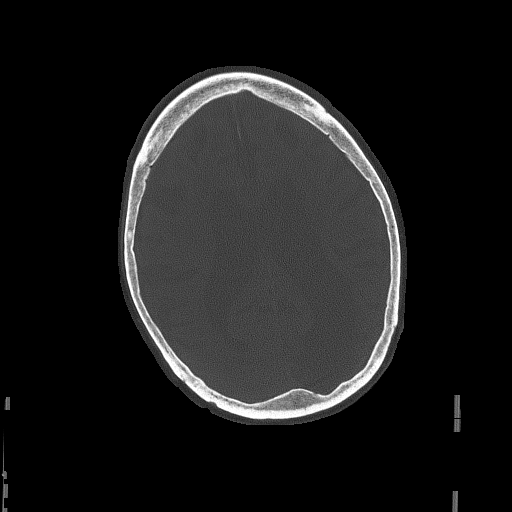
[im 74/89  bone]
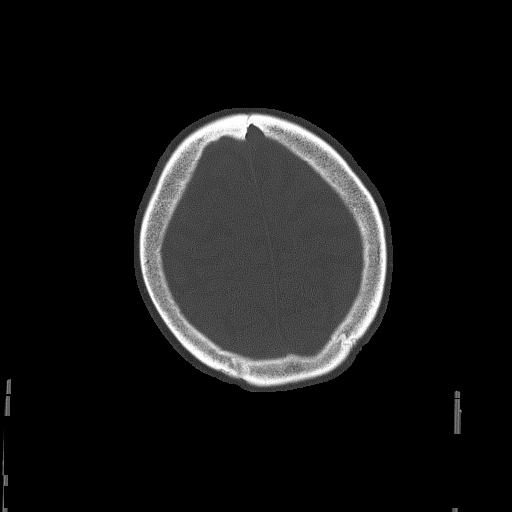

[18 of 30 positions shown; findings below may reference images not displayed]

FINDINGS: The brain is atrophic with chronic microvascular ischemic change.
Remote left occipital, left thalamic full and right caudate head
infarcts are again seen. No evidence of acute abnormality including
hemorrhage, infarct, mass lesion, mass effect, midline shift or
abnormal extra-axial fluid collection is identified. No
hydrocephalus or pneumocephalus. The calvarium is intact. The
patient is status post left mastoidectomy. Small amount of residual
mastoid air cells on the left have fluid within them.
IMPRESSION: No acute abnormality.

Atrophy, chronic microvascular ischemic change and remote infarcts
as above.
